# Patient Record
Sex: Female | Born: 1940 | Race: Black or African American | Hispanic: No | Smoking: Never smoker
Health system: Southern US, Community
[De-identification: ages and names within clinical notes are randomized; demographics above are authoritative.]

## PROBLEM LIST (undated history)

## (undated) DIAGNOSIS — D219 Benign neoplasm of connective and other soft tissue, unspecified: Secondary | ICD-10-CM

## (undated) DIAGNOSIS — I10 Essential (primary) hypertension: Secondary | ICD-10-CM

## (undated) DIAGNOSIS — E039 Hypothyroidism, unspecified: Secondary | ICD-10-CM

## (undated) DIAGNOSIS — E119 Type 2 diabetes mellitus without complications: Secondary | ICD-10-CM

## (undated) DIAGNOSIS — R569 Unspecified convulsions: Secondary | ICD-10-CM

## (undated) DIAGNOSIS — K219 Gastro-esophageal reflux disease without esophagitis: Secondary | ICD-10-CM

## (undated) DIAGNOSIS — D332 Benign neoplasm of brain, unspecified: Secondary | ICD-10-CM

## (undated) DIAGNOSIS — E785 Hyperlipidemia, unspecified: Secondary | ICD-10-CM

## (undated) HISTORY — DX: Essential (primary) hypertension: I10

## (undated) HISTORY — DX: Benign neoplasm of brain, unspecified: D33.2

## (undated) HISTORY — DX: Benign neoplasm of connective and other soft tissue, unspecified: D21.9

## (undated) HISTORY — DX: Gastro-esophageal reflux disease without esophagitis: K21.9

## (undated) HISTORY — DX: Hypothyroidism, unspecified: E03.9

## (undated) HISTORY — DX: Type 2 diabetes mellitus without complications: E11.9

## (undated) HISTORY — PX: TOTAL ABDOMINAL HYSTERECTOMY: SHX209

## (undated) HISTORY — DX: Hyperlipidemia, unspecified: E78.5

## (undated) HISTORY — DX: Unspecified convulsions: R56.9

---

## 1983-12-14 HISTORY — PX: BRAIN TUMOR EXCISION: SHX577

## 2001-12-13 HISTORY — PX: HERNIA REPAIR: SHX51

## 2001-12-13 HISTORY — PX: CHOLECYSTECTOMY: SHX55

## 2010-12-13 HISTORY — PX: THYROIDECTOMY: SHX17

## 2017-03-15 ENCOUNTER — Other Ambulatory Visit: Payer: Self-pay | Admitting: Family Medicine

## 2017-03-15 DIAGNOSIS — R1900 Intra-abdominal and pelvic swelling, mass and lump, unspecified site: Secondary | ICD-10-CM

## 2017-03-22 ENCOUNTER — Encounter: Payer: Self-pay | Admitting: Neurology

## 2017-03-22 ENCOUNTER — Ambulatory Visit (INDEPENDENT_AMBULATORY_CARE_PROVIDER_SITE_OTHER): Payer: Medicare Other | Admitting: Neurology

## 2017-03-22 ENCOUNTER — Encounter (INDEPENDENT_AMBULATORY_CARE_PROVIDER_SITE_OTHER): Payer: Self-pay

## 2017-03-22 VITALS — BP 153/87 | HR 83 | Ht 62.0 in | Wt 175.0 lb

## 2017-03-22 DIAGNOSIS — Z9889 Other specified postprocedural states: Secondary | ICD-10-CM | POA: Insufficient documentation

## 2017-03-22 DIAGNOSIS — G40209 Localization-related (focal) (partial) symptomatic epilepsy and epileptic syndromes with complex partial seizures, not intractable, without status epilepticus: Secondary | ICD-10-CM | POA: Diagnosis not present

## 2017-03-22 MED ORDER — DIAZEPAM 5 MG PO TABS: ORAL_TABLET | ORAL | 0 refills | Status: DC

## 2017-03-22 MED ORDER — LEVETIRACETAM 500 MG PO TABS: 500.0000 mg | ORAL_TABLET | Freq: Two times a day (BID) | ORAL | 11 refills | Status: DC

## 2017-03-22 NOTE — Progress Notes (Signed)
PATIENT: Lori Rios DOB: [birth year]  Chief Complaint  Patient presents with  . Seizures    She is here with her daughter, Lori Rios.  Reports having two seizures prior to having a benign brain tumor removed in 1985.  She had another seizure several years following this surgery and one seizure following a cholecystecomy in 2003.  No further seizures reported.  She has been on phenytoin sodium since her first event.  Marland Kitchen PCP    Leighton Ruff, MD     [place] is a 76 years old right-handed female, accompanied by her daughter Lori Rios, seen in refer by primary care physician Dr. Leighton Ruff, for evaluation of seizure, initial evaluation was on March 22 2017.  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 She wonders, if she still needs to continue antibiotic medications,  She lives with her daughter, not driving  I reviewed the laboratory evaluation on March 15 2017, A1c was 6.2, normal TSH, CMP, creatinine 0.84, with exception of mild elevated glucose 135, normal CBC, hemoglobin of 15, cholesterol level was 191, LDL was 112,  REVIEW OF SYSTEMS: Full 14 system review of systems performed and notable only for swelling in legs, hearing loss, ringing ears, spinning sensation, blurred vision, cough, snoring, constipation, feeling cold, joint pain, cramps, aching muscles, memory loss, numbness,  dizziness, seizure, snoring ALLERGIES: Allergies  Allergen Reactions  . Iodine Shortness Of Breath and Cough    HOME MEDICATIONS: Current Outpatient Prescriptions  Medication Sig Dispense Refill  . aspirin 81 MG tablet Take 81 mg by mouth daily.    Marland Kitchen atenolol-chlorthalidone (TENORETIC) 100-25 MG tablet Take 1 tablet by mouth daily.  0  . Folic Acid 5 MG CAPS Take 5 mg by mouth daily.    Marland Kitchen levothyroxine (SYNTHROID, LEVOTHROID) 50 MCG tablet TAKE 1 TABLET BY MOUTH ONCE DAILY IN THE MORNING 30 MINS BEFORE BREAKFAST  0  . losartan (COZAAR) 50 MG tablet Take 50 mg by mouth daily.  3  . metFORMIN (GLUCOPHAGE) 500 MG tablet TAKE 1 TABLET BY MOUTH TWICE A DAY WITH MORNING AND EVENING MEALS  4  . pantoprazole (PROTONIX) 40 MG tablet Take 40 mg by mouth daily.    . phenytoin (DILANTIN) 100 MG ER capsule TAKE 2 CAPS BY MOUTH 2 TIMES A DAY  2   No current facility-administered medications for this visit.     PAST MEDICAL HISTORY: Past Medical History:  Diagnosis Date  . Benign brain tumor ([place])   . Diabetes ([place])   . Fibroids    History   . GERD (gastroesophageal reflux disease)   . Hyperlipemia   . Hypertension   . Hypothyroidism   . Seizures ([place])     PAST SURGICAL HISTORY: Past Surgical History:  Procedure Laterality Date  . BRAIN TUMOR EXCISION  1985   Benign  . CHOLECYSTECTOMY  2003  . HERNIA REPAIR  2003  . THYROIDECTOMY  2012   x  2  . TOTAL ABDOMINAL HYSTERECTOMY  1980s    FAMILY HISTORY: Family History  Problem Relation Age of Onset  . Stroke Mother   . Stroke Father     SOCIAL HISTORY:  Social History   Social History  . Marital status: Widowed    Spouse name: N/A  . Number of children: 1  . Years of education: HS   Occupational History  . Retired    Social History Main Topics  . Smoking status: Never Smoker  . Smokeless tobacco: Never Used  . Alcohol use No  . Drug use: No  . Sexual activity: Not on file   Other Topics Concern  . Not on file     Social History Narrative   Lives at home with her daughter.   Right-handed.   No caffeine use.     PHYSICAL EXAM   Vitals:   03/22/17 0722  BP: (!) 153/87  Pulse: 83  Weight: 175 lb (79.4 kg)  Height: 5\' 2"  (1.575 m)    Not recorded      Body mass index is 32.01 kg/m.  PHYSICAL EXAMNIATION:  Gen: NAD, conversant, well nourised, obese, well groomed                     Cardiovascular: Regular rate rhythm, no peripheral edema, warm, nontender. Eyes: Conjunctivae clear without exudates or hemorrhage Neck: Supple, no carotid bruits. Pulmonary: Clear to auscultation bilaterally   NEUROLOGICAL EXAM:  MENTAL STATUS: Speech:    Speech is normal; fluent and spontaneous with normal comprehension.  Cognition:     Orientation to time, place and person     Normal recent and remote memory     Normal Attention span and concentration     Normal Language, naming, repeating,spontaneous speech     Fund of knowledge   CRANIAL NERVES: CN II: Visual fields are full to confrontation. Fundoscopic exam is normal with sharp discs and no vascular changes. Pupils are round equal and briskly reactive to light. CN III, IV, VI: extraocular movement are normal. No ptosis. CN V: Facial sensation is intact to pinprick in all 3 divisions bilaterally. Corneal responses are intact.  CN VII: Face is symmetric with normal eye closure and smile. CN VIII: Hearing is normal to rubbing fingers CN IX, X: Palate elevates symmetrically. Phonation is normal. CN XI: Head turning and shoulder shrug are intact CN XII: Tongue is midline with normal movements and no atrophy.  MOTOR: There is no pronator drift of out-stretched arms. Muscle bulk and tone are normal. Muscle strength is normal.  REFLEXES: Reflexes are 2+ and symmetric at the biceps, triceps, knees, and ankles. Plantar responses are flexor.  SENSORY: Intact to light touch, pinprick, positional sensation and vibratory sensation are intact in  fingers and toes.  COORDINATION: Rapid alternating movements and fine finger movements are intact. There is no dysmetria on finger-to-nose and heel-knee-shin.    GAIT/STANCE: Posture is normal. Gait is steady with normal steps, base, arm swing, and turning. Heel and toe walking are normal. Tandem gait is normal.  Romberg is absent.   DIAGNOSTIC DATA (LABS, IMAGING, TESTING) - I reviewed patient records, labs, notes, testing and imaging myself where available.   ASSESSMENT AND PLAN  TERESINA BUGAJ is a 76 y.o. 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 Ph.D.  [facility] [address], [place], [place] [address] Ph: [phone] Fax: [phone]  CC: Leighton Ruff, MD

## 2017-03-22 NOTE — Patient Instructions (Signed)
Start Keppra= levetiracetam 500mg  one tab twice a day.  Tapering off dilantin.  You are taking dilantin 100mg  2 tab twice a day now.  1st week: Dilantin 100mg  2 tab every night 2nd week, one tab every night 3rd week, stop

## 2017-03-25 ENCOUNTER — Ambulatory Visit (HOSPITAL_COMMUNITY)
Admission: RE | Admit: 2017-03-25 | Discharge: 2017-03-25 | Disposition: A | Discharge status: Home / Self Care | Payer: Medicare Other | Source: Ambulatory Visit | Attending: Family Medicine | Admitting: Family Medicine

## 2017-03-25 DIAGNOSIS — R1900 Intra-abdominal and pelvic swelling, mass and lump, unspecified site: Secondary | ICD-10-CM

## 2017-03-25 DIAGNOSIS — M899 Disorder of bone, unspecified: Secondary | ICD-10-CM | POA: Insufficient documentation

## 2017-03-25 MED ORDER — IOPAMIDOL (ISOVUE-300) INJECTION 61%
INTRAVENOUS | Status: AC
  Administered 2017-03-25: 100 mL
  Filled 2017-03-25: qty 100

## 2017-03-28 ENCOUNTER — Other Ambulatory Visit: Payer: Medicare Other

## 2017-04-04 ENCOUNTER — Ambulatory Visit (INDEPENDENT_AMBULATORY_CARE_PROVIDER_SITE_OTHER): Payer: Medicare Other | Admitting: Neurology

## 2017-04-04 DIAGNOSIS — G40209 Localization-related (focal) (partial) symptomatic epilepsy and epileptic syndromes with complex partial seizures, not intractable, without status epilepticus: Secondary | ICD-10-CM

## 2017-04-04 DIAGNOSIS — Z9889 Other specified postprocedural states: Secondary | ICD-10-CM

## 2017-04-08 NOTE — Procedures (Signed)
   HISTORY: 76 years old female, presented with seizure  TECHNIQUE:  16 channel EEG was performed based on standard 10-16 international system. One channel was dedicated to EKG, which has demonstrates normal sinus rhythm of 90 beats per minutes.  Upon awakening, the posterior background activity was well-developed, in alpha range, 10 Hz, reactive to eye opening and closure.  There was no evidence of epileptiform discharge.  Photic stimulation was performed, which induced a symmetric photic driving.  Hyperventilation was performed, there was no abnormality elicit.  No sleep was achieved.  CONCLUSION: This is a  normal awake EEG.  There is no electrodiagnostic evidence of epileptiform discharge.  Marcial Pacas, M.D. Ph.D.  [facility] [place], [place] [address] Phone: [phone] Fax:      [phone]

## 2017-04-12 ENCOUNTER — Other Ambulatory Visit: Payer: Medicare Other

## 2017-04-13 ENCOUNTER — Other Ambulatory Visit: Payer: Medicare Other

## 2017-04-13 ENCOUNTER — Ambulatory Visit
Admission: RE | Admit: 2017-04-13 | Discharge: 2017-04-13 | Disposition: A | Discharge status: Home / Self Care | Payer: Medicare Other | Source: Ambulatory Visit | Attending: Neurology | Admitting: Neurology

## 2017-04-13 DIAGNOSIS — Z9889 Other specified postprocedural states: Secondary | ICD-10-CM

## 2017-04-13 DIAGNOSIS — G40209 Localization-related (focal) (partial) symptomatic epilepsy and epileptic syndromes with complex partial seizures, not intractable, without status epilepticus: Secondary | ICD-10-CM | POA: Diagnosis not present

## 2017-04-13 MED ORDER — GADOBENATE DIMEGLUMINE 529 MG/ML IV SOLN
15.0000 mL | Freq: Once | INTRAVENOUS | Status: AC | PRN
  Administered 2017-04-13: 15 mL via INTRAVENOUS

## 2017-04-14 ENCOUNTER — Telehealth: Payer: Self-pay | Admitting: Neurology

## 2017-04-14 NOTE — Telephone Encounter (Signed)
Please call patient, MRI of the brain showed evidence of previous left frontal surgery, postsurgical scar, EEG was normal, Continue Keppra 500 mg twice a day, tapering off Dilantin as we previously discussed, return to clinic on May 24   IMPRESSION:  Abnormal MRI brain (with and without) demonstrating: 1. Left frontal resection cavity, mild surrounding gliosis, and overlying craniotomy and post-surgical artifacts / changes.  2. No abnormal enhancing lesions. 3. No acute findings.

## 2017-04-19 ENCOUNTER — Other Ambulatory Visit: Payer: Self-pay | Admitting: Gastroenterology

## 2017-04-19 DIAGNOSIS — R109 Unspecified abdominal pain: Secondary | ICD-10-CM

## 2017-04-30 ENCOUNTER — Ambulatory Visit
Admission: RE | Admit: 2017-04-30 | Discharge: 2017-04-30 | Disposition: A | Discharge status: Home / Self Care | Payer: Medicare Other | Source: Ambulatory Visit | Attending: Gastroenterology | Admitting: Gastroenterology

## 2017-04-30 DIAGNOSIS — R109 Unspecified abdominal pain: Secondary | ICD-10-CM

## 2017-04-30 MED ORDER — GADOBENATE DIMEGLUMINE 529 MG/ML IV SOLN
15.0000 mL | Freq: Once | INTRAVENOUS | Status: AC | PRN
  Administered 2017-04-30: 15 mL via INTRAVENOUS

## 2017-05-05 ENCOUNTER — Encounter: Payer: Self-pay | Admitting: Neurology

## 2017-05-05 ENCOUNTER — Ambulatory Visit (INDEPENDENT_AMBULATORY_CARE_PROVIDER_SITE_OTHER): Payer: Medicare Other | Admitting: Neurology

## 2017-05-05 VITALS — BP 157/67 | HR 76 | Resp 16 | Ht 62.0 in | Wt 180.0 lb

## 2017-05-05 DIAGNOSIS — Z9889 Other specified postprocedural states: Secondary | ICD-10-CM

## 2017-05-05 DIAGNOSIS — G40209 Localization-related (focal) (partial) symptomatic epilepsy and epileptic syndromes with complex partial seizures, not intractable, without status epilepticus: Secondary | ICD-10-CM | POA: Diagnosis not present

## 2017-05-05 MED ORDER — LEVETIRACETAM 500 MG PO TABS: 500.0000 mg | ORAL_TABLET | Freq: Two times a day (BID) | ORAL | 4 refills | Status: DC

## 2017-05-05 NOTE — Progress Notes (Signed)
PATIENT: Lori Rios DOB: [birth year]  Chief Complaint  Patient presents with  . Follow-up    Rm 4. Patient is here to discuss test results.      HISTORICAL  Lori Rios is a 76 years old right-handed female, accompanied by her daughter Lori Rios, seen in refer by primary care physician Dr. Leighton Ruff, for evaluation of seizure, initial evaluation was on March 22 2017.  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 Iodine Shortness Of Breath and Cough    HOME MEDICATIONS: Current Outpatient Prescriptions  Medication Sig Dispense Refill  . aspirin 81 MG tablet Take 81 mg by mouth daily.    Marland Kitchen atenolol-chlorthalidone (TENORETIC) 100-25 MG tablet Take 1 tablet by mouth daily.  0  . Folic Acid 5 MG CAPS Take 5 mg by mouth daily.    Marland Kitchen levETIRAcetam (KEPPRA) 500 MG tablet Take 1 tablet (500 mg total) by mouth 2 (two) times daily. 60 tablet 11  . levothyroxine (SYNTHROID, LEVOTHROID) 50 MCG tablet TAKE 1 TABLET BY MOUTH ONCE DAILY IN THE MORNING 30 MINS BEFORE BREAKFAST  0  . losartan (COZAAR) 50 MG tablet Take 50 mg by mouth daily.  3  . metFORMIN (GLUCOPHAGE) 500 MG tablet TAKE 1 TABLET BY MOUTH TWICE A DAY WITH MORNING AND EVENING MEALS  4  . pantoprazole (PROTONIX) 40 MG tablet Take 40 mg by mouth daily.     No current facility-administered medications for this visit.     PAST MEDICAL HISTORY: Past Medical History:  Diagnosis Date  . Benign brain tumor ([place])   . Diabetes ([place])   . Fibroids    History   . GERD (gastroesophageal reflux disease)   . Hyperlipemia   . Hypertension   . Hypothyroidism   . Seizures ([place])     PAST SURGICAL HISTORY: Past Surgical History:  Procedure Laterality Date  . BRAIN TUMOR EXCISION  1985   Benign  . CHOLECYSTECTOMY  2003  . HERNIA REPAIR  2003  . THYROIDECTOMY  2012   x 2  . TOTAL ABDOMINAL HYSTERECTOMY  1980s    FAMILY HISTORY: Family History  Problem Relation Age of Onset  . Stroke Mother   . Stroke Father     SOCIAL HISTORY:  Social History   Social History  . Marital status: Widowed    Spouse name: N/A  . Number of children: 1  . Years of education: HS   Occupational History  . Retired    Social History Main Topics  . Smoking status: Never Smoker  . Smokeless tobacco: Never Used  .  Alcohol use No  . Drug use: No  . Sexual activity: Not on file   Other Topics Concern  . Not on file   Social History Narrative   Lives at home with her daughter.   Right-handed.   No caffeine use.     PHYSICAL EXAM   Vitals:   05/05/17 0731  BP: (!) 157/67  Pulse: 76  Resp: 16  Weight: 180 lb (81.6 kg)  Height: 5\' 2"  (1.575 m)    Not recorded      Body mass index is 32.92 kg/m.  PHYSICAL EXAMNIATION:  Gen: NAD, conversant, well nourised, obese, well groomed                     Cardiovascular: Regular rate rhythm, no peripheral edema, warm, nontender. Eyes: Conjunctivae clear without exudates or hemorrhage Neck: Supple, no carotid bruits. Pulmonary: Clear to auscultation bilaterally   NEUROLOGICAL EXAM:  MENTAL STATUS: Speech:    Speech is normal; fluent and spontaneous with normal comprehension.  Cognition:     Orientation to time, place and person     Normal recent and remote memory     Normal Attention span and concentration     Normal Language, naming, repeating,spontaneous speech     Fund of knowledge   CRANIAL NERVES: CN II: Visual fields are full to confrontation. Fundoscopic exam is normal with sharp discs and no vascular changes. Pupils are round equal and briskly reactive to light. CN III, IV, VI: extraocular movement are normal. No ptosis. CN V: Facial sensation is intact to pinprick in all 3 divisions bilaterally. Corneal responses are intact.  CN VII: Face is symmetric with normal eye closure and smile. CN VIII: Hearing is normal to rubbing fingers CN IX, X: Palate elevates symmetrically. Phonation is normal. CN XI: Head turning and shoulder shrug are intact CN XII: Tongue is midline with normal movements and no atrophy.  MOTOR: There is no pronator drift of out-stretched arms. Muscle bulk and tone are normal. Muscle strength is normal.  REFLEXES: Reflexes are 2+ and symmetric at the biceps, triceps, knees, and ankles. Plantar responses  are flexor.  SENSORY: Intact to light touch, pinprick, positional sensation and vibratory sensation are intact in fingers and toes.  COORDINATION: Rapid alternating movements and fine finger movements are intact. There is no dysmetria on finger-to-nose and heel-knee-shin.    GAIT/STANCE: Posture is normal. Gait is steady with normal steps, base, arm swing, and turning. Heel and toe walking are normal. Tandem gait is normal.  Romberg is absent.   DIAGNOSTIC DATA (LABS, IMAGING, TESTING) - I reviewed patient records, labs, notes, testing and imaging myself where available.   ASSESSMENT AND PLAN  Lori Rios is a 76 y.o. 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 Ph.D.  [facility] [address] [place], [place] [address] Ph: [phone] Fax: [phone]  CC: Leighton Ruff, MD

## 2017-05-23 ENCOUNTER — Other Ambulatory Visit: Payer: Self-pay | Admitting: Gastroenterology

## 2017-05-31 ENCOUNTER — Other Ambulatory Visit: Payer: Self-pay | Admitting: Gastroenterology

## 2017-06-01 ENCOUNTER — Encounter (HOSPITAL_COMMUNITY)
Admission: RE | Disposition: A | Discharge status: Home / Self Care | Payer: Self-pay | Source: Ambulatory Visit | Attending: Gastroenterology

## 2017-06-01 ENCOUNTER — Ambulatory Visit (HOSPITAL_COMMUNITY): Payer: Medicare Other | Admitting: Anesthesiology

## 2017-06-01 ENCOUNTER — Ambulatory Visit (HOSPITAL_COMMUNITY)
Admission: RE | Admit: 2017-06-01 | Discharge: 2017-06-01 | Disposition: A | Discharge status: Home / Self Care | Payer: Medicare Other | Source: Ambulatory Visit | Attending: Gastroenterology | Admitting: Gastroenterology

## 2017-06-01 ENCOUNTER — Encounter (HOSPITAL_COMMUNITY): Payer: Self-pay | Admitting: *Deleted

## 2017-06-01 DIAGNOSIS — Z7984 Long term (current) use of oral hypoglycemic drugs: Secondary | ICD-10-CM | POA: Diagnosis not present

## 2017-06-01 DIAGNOSIS — K219 Gastro-esophageal reflux disease without esophagitis: Secondary | ICD-10-CM | POA: Insufficient documentation

## 2017-06-01 DIAGNOSIS — Z79899 Other long term (current) drug therapy: Secondary | ICD-10-CM | POA: Diagnosis not present

## 2017-06-01 DIAGNOSIS — Z7901 Long term (current) use of anticoagulants: Secondary | ICD-10-CM | POA: Diagnosis not present

## 2017-06-01 DIAGNOSIS — K862 Cyst of pancreas: Secondary | ICD-10-CM | POA: Insufficient documentation

## 2017-06-01 DIAGNOSIS — E890 Postprocedural hypothyroidism: Secondary | ICD-10-CM | POA: Insufficient documentation

## 2017-06-01 DIAGNOSIS — I10 Essential (primary) hypertension: Secondary | ICD-10-CM | POA: Diagnosis not present

## 2017-06-01 DIAGNOSIS — E119 Type 2 diabetes mellitus without complications: Secondary | ICD-10-CM | POA: Insufficient documentation

## 2017-06-01 HISTORY — PX: FINE NEEDLE ASPIRATION: SHX5430

## 2017-06-01 HISTORY — PX: EUS: SHX5427

## 2017-06-01 LAB — GLUCOSE, CAPILLARY: Glucose-Capillary: 107 mg/dL — ABNORMAL HIGH (ref 65–99)

## 2017-06-01 SURGERY — ESOPHAGEAL ENDOSCOPIC ULTRASOUND (EUS) RADIAL
Anesthesia: Monitor Anesthesia Care

## 2017-06-01 MED ORDER — SODIUM CHLORIDE 0.9 % IV SOLN: INTRAVENOUS | Status: DC

## 2017-06-01 MED ORDER — PROPOFOL 500 MG/50ML IV EMUL
INTRAVENOUS | Status: DC | PRN
  Administered 2017-06-01: 125 ug/kg/min via INTRAVENOUS

## 2017-06-01 MED ORDER — CIPROFLOXACIN IN D5W 400 MG/200ML IV SOLN
INTRAVENOUS | Status: AC
  Filled 2017-06-01: qty 200

## 2017-06-01 MED ORDER — PROPOFOL 500 MG/50ML IV EMUL
INTRAVENOUS | Status: DC | PRN
  Administered 2017-06-01: 40 mg via INTRAVENOUS
  Administered 2017-06-01: 50 mg via INTRAVENOUS

## 2017-06-01 MED ORDER — LACTATED RINGERS IV SOLN
INTRAVENOUS | Status: DC
  Administered 2017-06-01 (×2): via INTRAVENOUS

## 2017-06-01 MED ORDER — PROPOFOL 10 MG/ML IV BOLUS
INTRAVENOUS | Status: AC
  Filled 2017-06-01: qty 40

## 2017-06-01 NOTE — Discharge Instructions (Signed)

## 2017-06-01 NOTE — Anesthesia Preprocedure Evaluation (Signed)
Anesthesia Evaluation  Patient identified by MRN, date of birth, ID band Patient awake    Reviewed: Allergy & Precautions, NPO status , Patient's Chart, lab work & pertinent test results  Airway Mallampati: I       Dental no notable dental hx.    Pulmonary neg pulmonary ROS,    Pulmonary exam normal breath sounds clear to auscultation       Cardiovascular hypertension, Pt. on home beta blockers and Pt. on medications Normal cardiovascular exam Rhythm:Regular Rate:Normal     Neuro/Psych negative psych ROS   GI/Hepatic Neg liver ROS, GERD  Medicated and Controlled,  Endo/Other  diabetes, Type 2, Oral Hypoglycemic AgentsHypothyroidism   Renal/GU negative Renal ROS  negative genitourinary   Musculoskeletal negative musculoskeletal ROS (+)   Abdominal Normal abdominal exam  (+)   Peds  Hematology   Anesthesia Other Findings   Reproductive/Obstetrics                             Anesthesia Physical Anesthesia Plan  ASA: II  Anesthesia Plan: MAC   Post-op Pain Management:    Induction: Intravenous  PONV Risk Score and Plan: 2 and Ondansetron, Dexamethasone and Treatment may vary due to age or medical condition  Airway Management Planned: Natural Airway and Simple Face Mask  Additional Equipment:   Intra-op Plan:   Post-operative Plan:   Informed Consent: I have reviewed the patients History and Physical, chart, labs and discussed the procedure including the risks, benefits and alternatives for the proposed anesthesia with the patient or authorized representative who has indicated his/her understanding and acceptance.     Plan Discussed with: CRNA and Surgeon  Anesthesia Plan Comments:         Anesthesia Quick Evaluation

## 2017-06-01 NOTE — Transfer of Care (Signed)
Immediate Anesthesia Transfer of Care Note  Patient: Lori Rios  Procedure(s) Performed: Procedure(s): ESOPHAGEAL ENDOSCOPIC ULTRASOUND (EUS) RADIAL (N/A) FINE NEEDLE ASPIRATION (FNA) RADIAL (N/A)  Patient Location: PACU  Anesthesia Type:MAC  Level of Consciousness:  sedated, patient cooperative and responds to stimulation  Airway & Oxygen Therapy:Patient Spontanous Breathing and Patient connected to face mask oxgen  Post-op Assessment:  Report given to PACU RN and Post -op Vital signs reviewed and stable  Post vital signs:  Reviewed and stable  Last Vitals:  Vitals:   06/01/17 1036  BP: (!) 180/75  Resp: 17  Temp: 30.9 C    Complications: No apparent anesthesia complications

## 2017-06-01 NOTE — Op Note (Signed)
[facility] Patient Name: Lori Rios Procedure Date: 06/01/2017 MRN: [id number] Attending MD: Arta Silence , MD Date of Birth: July 22, 1941 CSN: [id number] Age: 76 Admit Type: Outpatient Procedure:                Upper EUS Indications:              Pancreatic cyst on MRI Providers:                Arta Silence, MD, Elmer Ramp. Hinson, RN, Cherylynn Ridges, Technician, [employer], CRNA Referring MD:              Medicines:                Monitored Anesthesia Care Complications:            No immediate complications. Estimated Blood Loss:     Estimated blood loss was minimal. Procedure:                Pre-Anesthesia Assessment:                           - Prior to the procedure, a History and Physical                            was performed, and patient medications and                            allergies were reviewed. The patient's tolerance of                            previous anesthesia was also reviewed. The risks                            and benefits of the procedure and the sedation                            options and risks were discussed with the patient.                            All questions were answered, and informed consent                            was obtained. Prior Anticoagulants: The patient has                            taken aspirin. ASA Grade Assessment: II - A patient                            with mild systemic disease. After reviewing the                            risks and benefits, the patient was deemed in  satisfactory condition to undergo the procedure.                           After obtaining informed consent, the endoscope was                            passed under direct vision. Throughout the                            procedure, the patient's blood pressure, pulse, and                            oxygen saturations were monitored continuously. The          [id number] ([id number]) scope was introduced through                            the mouth, and advanced to the second part of                            duodenum. The [id number] ([id number]) scope was                            introduced through the mouth, and advanced to the                            second part of duodenum. The upper EUS was                            accomplished without difficulty. The patient                            tolerated the procedure well. There was some error                            in imaging processor; thus while I could see                            images, we could not take or save images. Scope In: Scope Out: Findings:      Endosonographic Finding :      Endosonographic images of the stomach were unremarkable.      There was no sign of significant endosonographic abnormality in the       duodenal bulb.      There was no sign of significant endosonographic abnormality in the       common bile duct.      There was no sign of significant endosonographic abnormality in the left       lobe of the liver.      An anechoic, hypoechoic and multicystic lesion suggestive of a cyst was       identified in the pancreatic head and in the genu of the pancreas. It is       not in obvious communication with the pancreatic duct. The lesion       measured 50 mm by 85 mm in maximal cross-sectional diameter. There was a  single compartment thinly septated. The outer wall of the lesion was not       seen. There seemed to be some mild extrinsice compression of stomach and       duodenum. There was no internal debris within the fluid-filled cavity.       No solid component, lymphadenopathy or mural nodule seen. Some overlying       vasculature seen; given classic appearance of lesion on EUS and MRI, I       felt risks of biopsy outweighed the benefits.      No lymphadenopathy seen. Impression:               - Endosonographic images of the stomach were                             unremarkable.                           - There was no sign of significant pathology in the                            duodenal bulb.                           - There was no sign of significant pathology in the                            common bile duct.                           - There was no evidence of significant pathology in                            the left lobe of the liver.                           - A cystic lesion was seen in the pancreatic head                            and in the genu of the pancreas. Tissue has not                            been obtained. However, the endosonographic                            appearance (and MRI appearance) is highly                            suspicious for a serous cystadenoma.                           - No specimens collected. Moderate Sedation:      None Recommendation:           - Discharge patient to home (ambulatory).                           -  Resume previous diet today.                           - Continue present medications.                           - Return to GI clinic in 6 months.                           - Return to referring physician as previously                            scheduled.                           - Will discuss with surgeon and see whether, given                            lesion's size, there is any role in resection                            outside of biliary obstruction or gastroduodenal                            obstruction; if not, would likely repeat MRI in one                            year for surveillance, even though this lesion is                            known to be exceedingly unlikely to progress to                            cancer. Procedure Code(s):        --- Professional ---                           [phone], Esophagogastroduodenoscopy, flexible,                            transoral; with endoscopic ultrasound examination,                             including the esophagus, stomach, and either the                            duodenum or a surgically altered stomach where the                            jejunum is examined distal to the anastomosis Diagnosis Code(s):        --- Professional ---                           K86.2, Cyst of pancreas CPT copyright 2016 American Medical Association. All rights reserved. The codes documented in this report  are preliminary and upon coder review may  be revised to meet current compliance requirements. Arta Silence, MD 06/01/2017 12:44:17 PM This report has been signed electronically. Number of Addenda: 0

## 2017-06-01 NOTE — H&P (Signed)
Patient interval history reviewed.  Patient examined again.  There has been no change from documented H/P dated 05/31/17 (scanned into chart from our office) except as documented above.  Assessment:  1.  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  Plan:  1.  Endoscopic ultrasound with possible fine needle aspiration. 2.  Risks (bleeding, infection, bowel perforation that could require surgery, sedation-related changes in cardiopulmonary systems), benefits (identification and possible treatment of source of symptoms, exclusion of certain causes of symptoms), and alternatives (watchful waiting, radiographic imaging studies, empiric medical treatment) of upper endoscopy with ultrasound and possible fine needle aspiration (EUS +/- FNA) were explained to patient/family in detail and patient wishes to proceed.

## 2017-06-06 NOTE — Anesthesia Postprocedure Evaluation (Signed)
Anesthesia Post Note  Patient: Lori Rios  Procedure(s) Performed: Procedure(s) (LRB): ESOPHAGEAL ENDOSCOPIC ULTRASOUND (EUS) RADIAL (N/A) FINE NEEDLE ASPIRATION (FNA) RADIAL (N/A)     Patient location during evaluation: Endoscopy Anesthesia Type: MAC Level of consciousness: awake Pain management: pain level controlled Vital Signs Assessment: post-procedure vital signs reviewed and stable Respiratory status: spontaneous breathing, nonlabored ventilation, respiratory function stable and patient connected to nasal cannula oxygen Cardiovascular status: stable and blood pressure returned to baseline Anesthetic complications: no    Last Vitals:  Vitals:   06/01/17 1240 06/01/17 1250  BP: 129/70 134/70  Pulse: 88 83  Resp: 19 12  Temp:      Last Pain:  Vitals:   06/02/17 1613  TempSrc:   PainSc: 0-No pain                 Quinette Hentges

## 2017-06-21 ENCOUNTER — Other Ambulatory Visit: Payer: Self-pay | Admitting: Gastroenterology

## 2017-06-21 DIAGNOSIS — K862 Cyst of pancreas: Secondary | ICD-10-CM

## 2017-06-21 DIAGNOSIS — R935 Abnormal findings on diagnostic imaging of other abdominal regions, including retroperitoneum: Secondary | ICD-10-CM

## 2018-03-15 ENCOUNTER — Other Ambulatory Visit: Payer: Self-pay | Admitting: General Surgery

## 2018-03-15 DIAGNOSIS — D279 Benign neoplasm of unspecified ovary: Secondary | ICD-10-CM

## 2018-05-04 NOTE — Progress Notes (Deleted)
[facility]  PATIENT: Lori Rios DOB: 02/26/1941   REASON FOR VISIT: *** HISTORY FROM:    HISTORY OF PRESENT ILLNESS: Lori Rios is a 77 years old right-handed female, accompanied by her daughter Lori Rios, seen in refer by primary care physician Dr. Leighton Ruff, for evaluation of seizure, initial evaluation was on March 22 2017.  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 She wonders, if she still needs to continue antibiotic medications,  She lives with her daughter, not driving  I reviewed the laboratory evaluation on March 15 2017, A1c was 6.2, normal TSH, CMP, creatinine 0.84, with exception of mild elevated glucose 135, normal CBC, hemoglobin of 15, cholesterol level was 191, LDL was 112,  UPDATE May 05 2017: EEG on April 04 2017 showed no significant abnormality  MRI of abdomen showed 4.8 times 9.3 times 6.5 centimeter lobulated septated, microcystic pancreatic lesions along the anterior aspect of the pancreatic head with contrast enhancement favoring a benign serous cystadenoma  She is tolerating Keppra 500 mg twice a day well, no recurrent event  We have personally reviewed MRI of the brain with and without contrast in May 2018, left frontal resection cavity, mild surrounding gliosis, mild generalized atrophy supratentorium small vessel disease    REVIEW OF  SYSTEMS: Full 14 system review of systems performed and notable only for those listed, all others are neg:  Constitutional: neg  Cardiovascular: neg Ear/Nose/Throat: neg  Skin: neg Eyes: neg Respiratory: neg Gastroitestinal: neg  Hematology/Lymphatic: neg  Endocrine: neg Musculoskeletal:neg Allergy/Immunology: neg Neurological: neg Psychiatric: neg Sleep : neg   ALLERGIES: Allergies  Allergen Reactions  . Iodine Shortness Of Breath and Cough    HOME MEDICATIONS: Outpatient Medications Prior to Visit  Medication Sig Dispense Refill  . aspirin 81 MG tablet Take 81 mg by mouth daily.    Marland Kitchen atenolol-chlorthalidone (TENORETIC) 100-25 MG tablet Take 1 tablet by mouth daily.  0  . Calcium Carb-Cholecalciferol (CALCIUM + D3 PO) Take 1 tablet by mouth daily.    . Folic Acid 5 MG CAPS Take 5 mg by mouth daily.    Marland Kitchen levETIRAcetam (KEPPRA) 500 MG tablet Take 1 tablet (500 mg total) by mouth 2 (two) times daily. 180 tablet 4  . levothyroxine (SYNTHROID, LEVOTHROID) 50 MCG tablet TAKE 1 TABLET BY MOUTH ONCE DAILY IN THE MORNING 30 MINS BEFORE BREAKFAST  0  . losartan (COZAAR) 50 MG tablet Take 50 mg by mouth daily.  3  . metFORMIN (GLUCOPHAGE) 500 MG tablet TAKE 1 TABLET BY MOUTH TWICE A DAY WITH MORNING AND EVENING MEALS  4  . pantoprazole (PROTONIX) 40 MG tablet Take 40 mg by mouth daily.     No facility-administered medications prior to visit.     PAST MEDICAL HISTORY: Past Medical History:  Diagnosis Date  . Benign brain tumor ([place])   . Diabetes ([place])   . Fibroids    History   . GERD (gastroesophageal reflux disease)   .  Hyperlipemia   . Hypertension   . Hypothyroidism   . Seizures ([place])     PAST SURGICAL HISTORY: Past Surgical History:  Procedure Laterality Date  . BRAIN TUMOR EXCISION  1985   Benign  . CHOLECYSTECTOMY  2003  . EUS N/A 06/01/2017   Procedure: ESOPHAGEAL ENDOSCOPIC ULTRASOUND (EUS) RADIAL;  Surgeon: Arta Silence, MD;  Location: [facility]COPY;   Service: Endoscopy;  Laterality: N/A;  . FINE NEEDLE ASPIRATION N/A 06/01/2017   Procedure: FINE NEEDLE ASPIRATION (FNA) RADIAL;  Surgeon: Arta Silence, MD;  Location: [facility]COPY;  Service: Endoscopy;  Laterality: N/A;  . HERNIA REPAIR  2003  . THYROIDECTOMY  2012   x 2  . TOTAL ABDOMINAL HYSTERECTOMY  1980s    FAMILY HISTORY: Family History  Problem Relation Age of Onset  . Stroke Mother   . Stroke Father     SOCIAL HISTORY: Social History   Socioeconomic History  . Marital status: Widowed    Spouse name: Not on file  . Number of children: 1  . Years of education: HS  . Highest education level: Not on file  Occupational History  . Occupation: Retired  Scientific laboratory technician  . Financial resource strain: Not on file  . Food insecurity:    Worry: Not on file    Inability: Not on file  . Transportation needs:    Medical: Not on file    Non-medical: Not on file  Tobacco Use  . Smoking status: Never Smoker  . Smokeless tobacco: Never Used  Substance and Sexual Activity  . Alcohol use: No  . Drug use: No  . Sexual activity: Not on file  Lifestyle  . Physical activity:    Days per week: Not on file    Minutes per session: Not on file  . Stress: Not on file  Relationships  . Social connections:    Talks on phone: Not on file    Gets together: Not on file    Attends religious service: Not on file    Active member of club or organization: Not on file    Attends meetings of clubs or organizations: Not on file    Relationship status: Not on file  . Intimate partner violence:    Fear of current or ex partner: Not on file    Emotionally abused: Not on file    Physically abused: Not on file    Forced sexual activity: Not on file  Other Topics Concern  . Not on file  Social History Narrative   Lives at home with her daughter.   Right-handed.   No caffeine use.     PHYSICAL EXAM  There were no vitals filed for this visit. There is no height or weight on file to  calculate BMI.  Generalized: Well developed, in no acute distress  Head: normocephalic and atraumatic,. Oropharynx benign  Neck: Supple, no carotid bruits  Cardiac: Regular rate rhythm, no murmur  Musculoskeletal: No deformity   Neurological examination   Mentation: Alert oriented to time, place, history taking. Attention span and concentration appropriate. Recent and remote memory intact.  Follows all commands speech and language fluent.   Cranial nerve II-XII: Fundoscopic exam reveals sharp disc margins.Pupils were equal round reactive to light extraocular movements were full, visual field were full on confrontational test. Facial sensation and strength were normal. hearing was intact to finger rubbing bilaterally. Uvula tongue midline. head turning and shoulder shrug were normal and symmetric.Tongue protrusion into cheek strength was normal. Motor: normal bulk  and tone, full strength in the BUE, BLE, fine finger movements normal, no pronator drift. No focal weakness Sensory: normal and symmetric to light touch, pinprick, and  Vibration, proprioception  Coordination: finger-nose-finger, heel-to-shin bilaterally, no dysmetria Reflexes: Brachioradialis 2/2, biceps 2/2, triceps 2/2, patellar 2/2, Achilles 2/2, plantar responses were flexor bilaterally. Gait and Station: Rising up from seated position without assistance, normal stance,  moderate stride, good arm swing, smooth turning, able to perform tiptoe, and heel walking without difficulty. Tandem gait is steady  DIAGNOSTIC DATA (LABS, IMAGING, TESTING) - I reviewed patient records, labs, notes, testing and imaging myself where available.  No results found for: WBC, HGB, HCT, MCV, PLT No results found for: NA, K, CL, CO2, GLUCOSE, BUN, CREATININE, CALCIUM, PROT, ALBUMIN, AST, ALT, ALKPHOS, BILITOT, GFRNONAA, GFRAA No results found for: CHOL, HDL, LDLCALC, LDLDIRECT, TRIG, CHOLHDL No results found for: HGBA1C No results found for:  VITAMINB12 No results found for: TSH  ***  ASSESSMENT AND PLAN  77 y.o. year old female  has a past medical history of Benign brain tumor ([place]), Diabetes ([place]), Fibroids, GERD (gastroesophageal reflux disease), Hyperlipemia, Hypertension, Hypothyroidism, and Seizures ([place]). here with ***  Lori Rios is a 77 y.o. 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

## 2018-05-05 ENCOUNTER — Ambulatory Visit: Payer: Medicare Other | Admitting: Nurse Practitioner

## 2018-05-09 ENCOUNTER — Encounter: Payer: Self-pay | Admitting: Nurse Practitioner

## 2018-06-01 ENCOUNTER — Other Ambulatory Visit: Payer: Self-pay | Admitting: Neurology

## 2019-02-24 IMAGING — MR MR ABDOMEN WO/W CM
17 of 19 series · 42 of 48 positions shown · IV contrast (Multihance 15ml)
Comparison: CT abdomen/pelvis dated 03/25/2017

CLINICAL DATA: Pancreatic mass on CT

EXAM:
MRI ABDOMEN AND PELVIS WITHOUT AND WITH CONTRAST
TECHNIQUE: Multiplanar multisequence MR imaging of the abdomen and pelvis was
performed both before and after the administration of intravenous
contrast.
CONTRAST:  15mL MULTIHANCE GADOBENATE DIMEGLUMINE 529 MG/ML IV SOLN
Creatinine was obtained on site at [HOSPITAL] at [HOSPITAL].
Results: Creatinine 0.9 mg/dL.

[Series 3: T2 · coronal · 5.0mm · 1.56mm/px · 1 of 36 slices shown (1 of 4)]
[im 1/36]
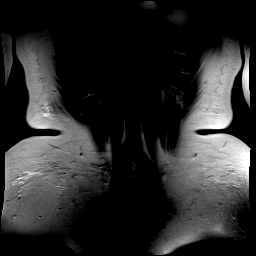

[Series 4: T1 · axial · 3.0mm · 1.19mm/px · z∈[-67,+146]mm · 6 of 144 slices shown]
[im 1/144]
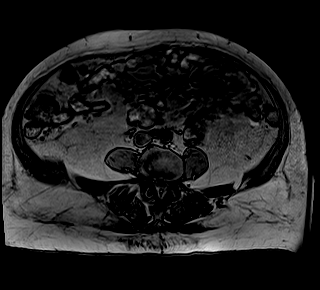
[im 29/144]
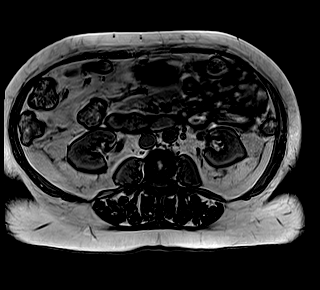
[im 58/144]
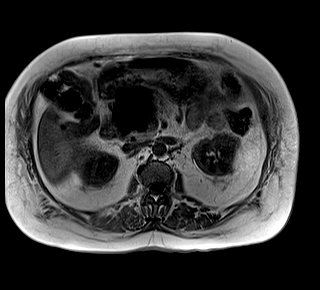
[im 86/144]
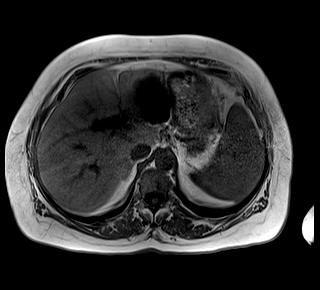
[im 115/144]
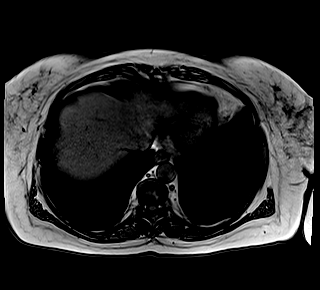
[im 144/144]
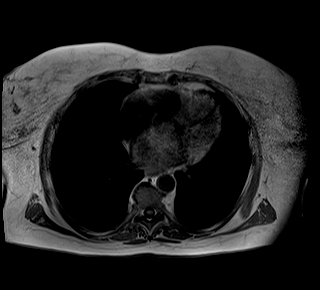

[Series 5: T2 · axial · 5.0mm · 1.48mm/px · 1 of 38 slices shown (2 of 4)]
[im 1/38]
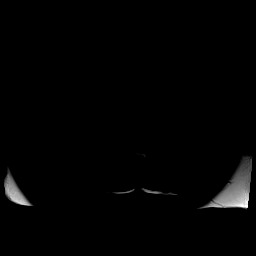

[Series 6: DWI · axial · 5.0mm · 1.42mm/px · z∈[-29,+181]mm · 4 of 108 slices shown (1 of 2)]
[im 1/108]
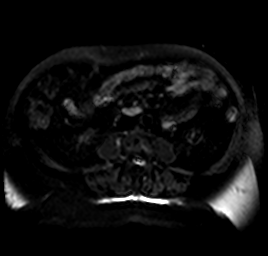
[im 36/108]
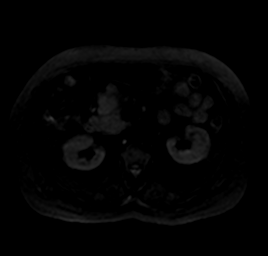
[im 72/108]
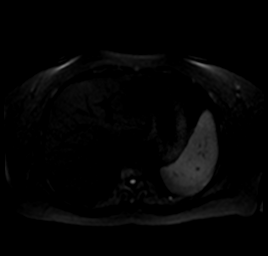
[im 108/108]
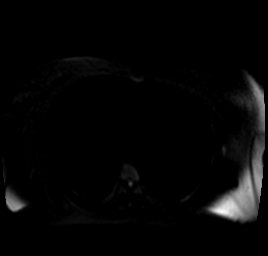

[Series 7: DWI · axial · 5.0mm · 1.42mm/px · 1 of 36 slices shown (2 of 2)]
[im 1/36]
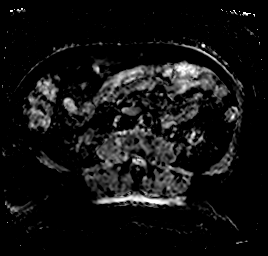

[Series 8: T2 · axial · 6.0mm · 1.22mm/px · 1 of 30 slices shown (3 of 4)]
[im 1/30]
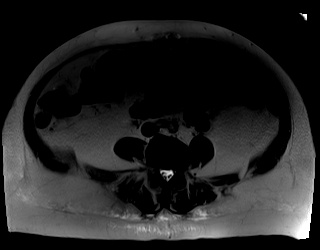

[Series 9: bSSFP · axial · 5.0mm · 1.25mm/px · 1 of 38 slices shown]
[im 1/38]
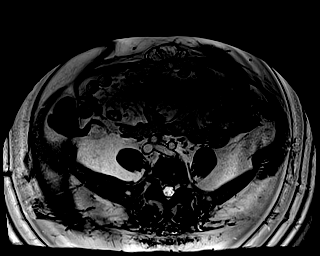

[Series 12: T2 · coronal · 3.0mm · 1.19mm/px · 1 of 19 slices shown (4 of 4)]
[im 1/19]
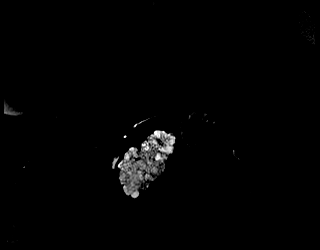

[Series 14: MRCP · coronal · 1.0mm · 0.49mm/px · 2 of 64 slices shown]
[im 1/64]
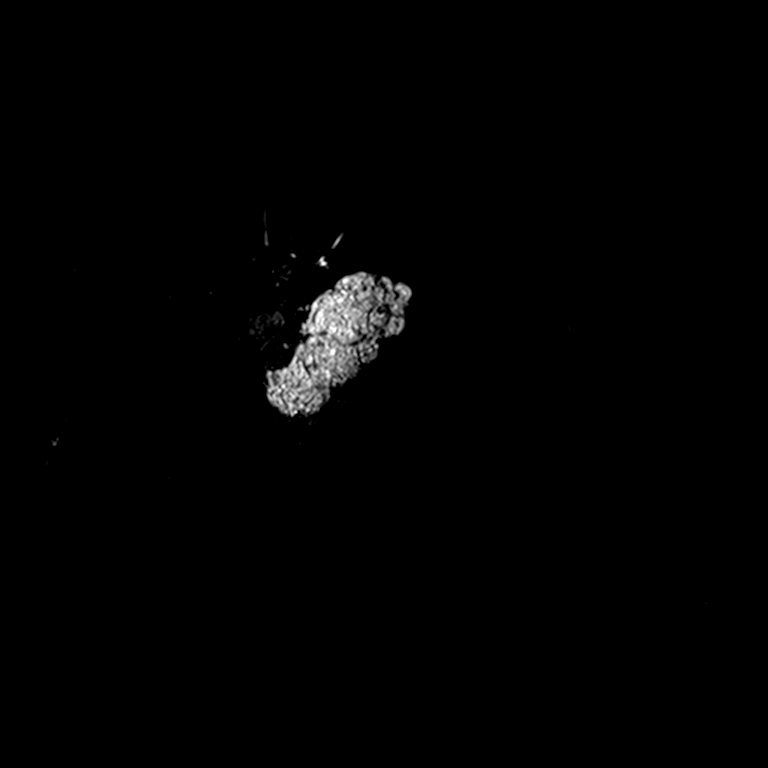
[im 64/64]
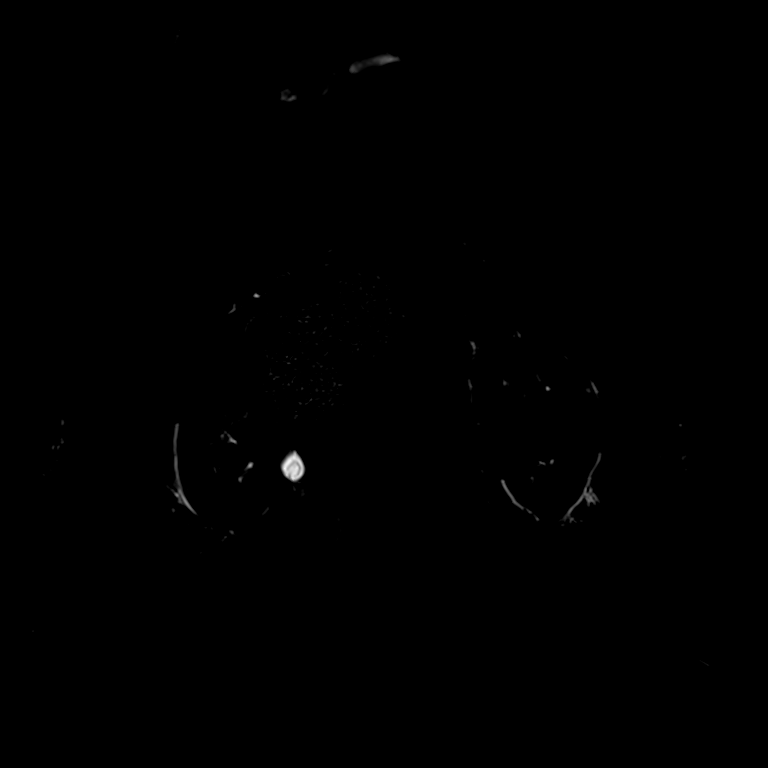

[Series 16: T1 dynamic · axial · non-contrast · 3.0mm · 1.25mm/px · z∈[-65,+148]mm · 3 of 72 slices shown]
[im 1/72]
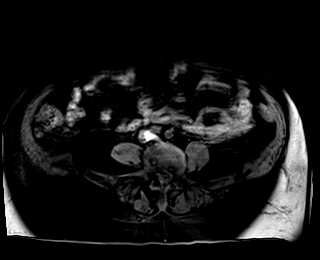
[im 36/72]
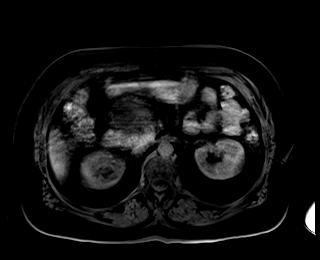
[im 72/72]
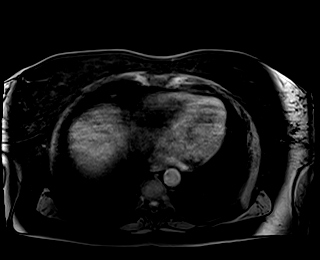

[Series 28: T1 dynamic post-contrast · axial · 3.0mm · 1.25mm/px · z∈[-65,+148]mm · 3 of 72 slices shown (1 of 7)]
[im 1/72]
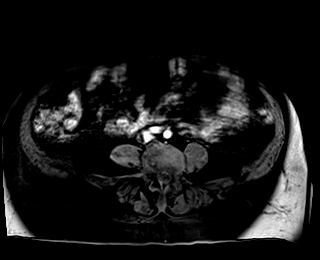
[im 36/72]
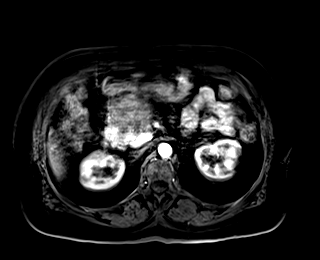
[im 72/72]
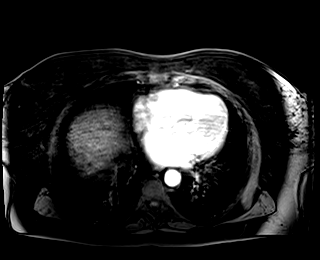

[Series 29: T1 dynamic post-contrast · axial · 3.0mm · 1.25mm/px · z∈[-65,+148]mm · 3 of 72 slices shown (2 of 7)]
[im 1/72]
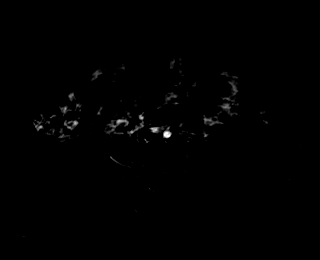
[im 36/72]
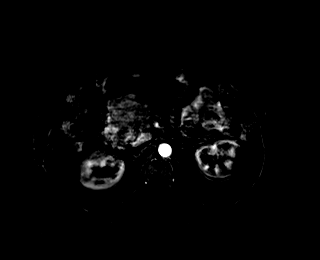
[im 72/72]
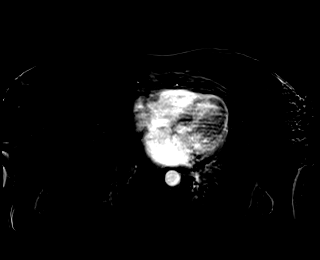

[Series 30: T1 dynamic post-contrast · axial · 3.0mm · 1.25mm/px · z∈[-65,+148]mm · 3 of 72 slices shown (3 of 7)]
[im 1/72]
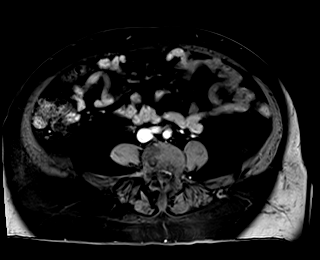
[im 36/72]
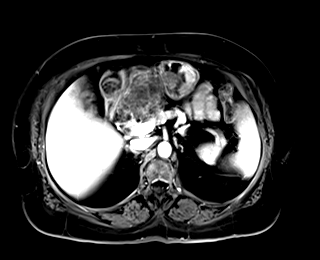
[im 72/72]
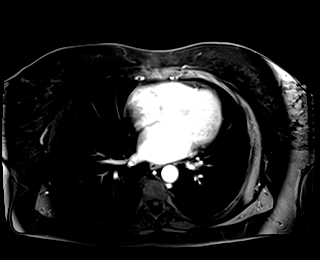

[Series 31: T1 dynamic post-contrast · axial · 3.0mm · 1.25mm/px · z∈[-65,+148]mm · 3 of 72 slices shown (4 of 7)]
[im 1/72]
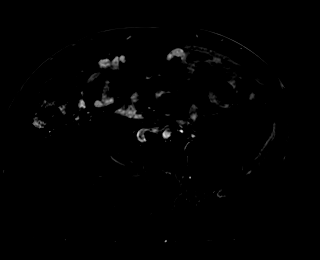
[im 36/72]
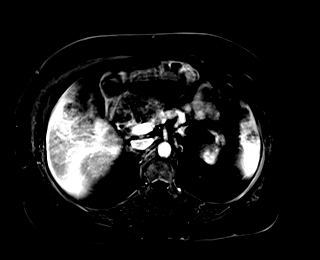
[im 72/72]
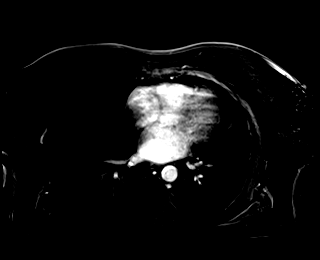

[Series 32: T1 dynamic post-contrast · axial · 3.0mm · 1.25mm/px · z∈[-65,+148]mm · 3 of 72 slices shown (5 of 7)]
[im 1/72]
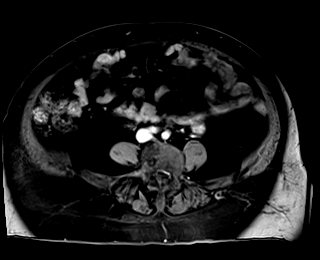
[im 36/72]
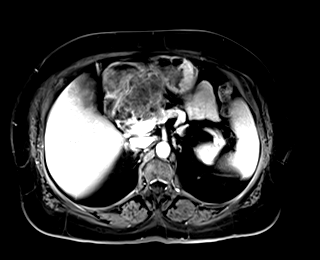
[im 72/72]
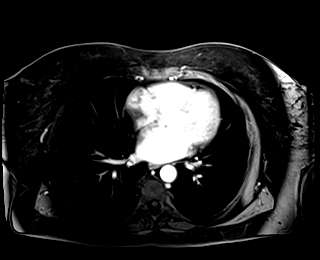

[Series 33: T1 dynamic post-contrast · axial · 3.0mm · 1.25mm/px · z∈[-65,+148]mm · 3 of 72 slices shown (6 of 7)]
[im 1/72]
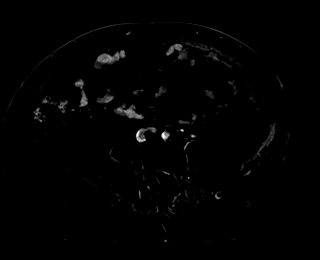
[im 36/72]
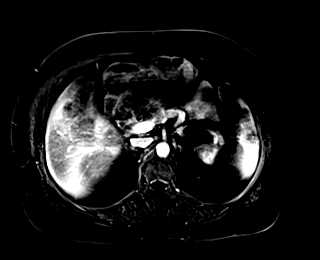
[im 72/72]
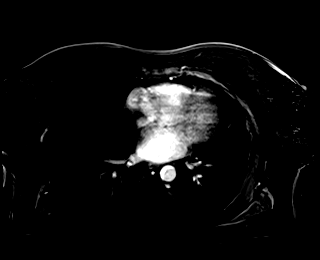

[Series 34: T1 dynamic post-contrast · coronal · 3.0mm · 1.25mm/px · 3 of 72 slices shown (7 of 7)]
[im 1/72]
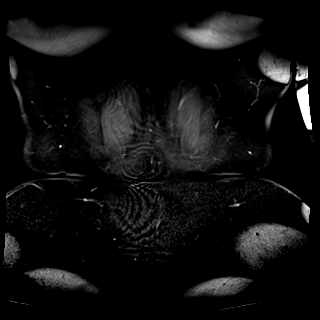
[im 36/72]
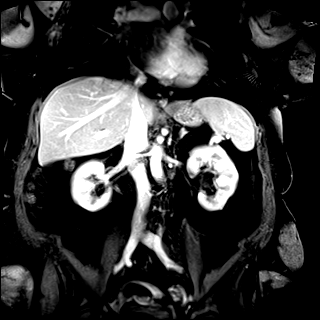
[im 72/72]
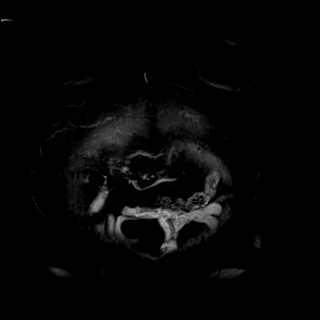

[42 of 48 positions shown; findings below may reference images not displayed]

FINDINGS: Lower chest: Lung bases are clear.

Hepatobiliary: Liver is within normal limits.

Status post cholecystectomy. No intrahepatic or extrahepatic ductal
dilatation.

Pancreas: Atrophy of the pancreatic body/ tail versus dorsal
pancreatic agenesis.

4.8 x 9.3 x 6.5 cm lobulated, septated, microcystic pancreatic
lesion arising along the anterior aspect of the pancreatic head/
proximal body (series 3/image 22). Associated vague internal/septal
enhancement following contrast administration on subtraction imaging
(series 33/ image 37). No definite central scar/ calcification on
CT. However, this appearance favors a benign serous cystadenoma.

Spleen:  Within normal limits.

Adrenals/Urinary Tract:  Adrenal glands are within normal limits.

6 mm right lower pole renal cyst (series 8/ image 25). Kidneys are
otherwise within normal limits. No hydronephrosis.

Bladder is within normal limits.

Stomach/Bowel: Stomach is within normal limits.

Visualized bowel is unremarkable.

Vascular/Lymphatic:  No evidence of abdominal aortic aneurysm.

No suspicious abdominopelvic lymphadenopathy.

Reproductive: Status post hysterectomy.

No adnexal masses.

Other:  No abdominopelvic ascites.

Musculoskeletal: Probable benign bone island involving the right
sacrum (series 3/image 8), without enhancement.
IMPRESSION: 9.3 cm lobulated, microcystic pancreatic lesion along the anterior
aspect of the pancreatic head/body, with enhancement following
contrast administration, favoring a benign serous cystadenoma.
However, given size, EUS is suggested for sampling.

Probable benign bone island involving the right sacrum.

Additional ancillary findings as above.
